# Patient Record
Sex: Male | Born: 1995 | Race: Black or African American | Hispanic: No | Marital: Single | Smoking: Never smoker
Health system: Southern US, Community
[De-identification: ages and names within clinical notes are randomized; demographics above are authoritative.]

---
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
## 2021-02-05 ENCOUNTER — Other Ambulatory Visit: Payer: Self-pay

## 2021-02-05 ENCOUNTER — Encounter: Payer: Self-pay | Admitting: Physical Therapy

## 2021-02-05 ENCOUNTER — Ambulatory Visit: Payer: Medicaid Other | Attending: Orthopaedic Surgery | Admitting: Physical Therapy

## 2021-02-05 DIAGNOSIS — M25512 Pain in left shoulder: Secondary | ICD-10-CM | POA: Insufficient documentation

## 2021-02-05 DIAGNOSIS — M6281 Muscle weakness (generalized): Secondary | ICD-10-CM | POA: Diagnosis present

## 2021-02-05 NOTE — Therapy (Signed)
[facility] [address] [place], [place], [address] Phone: [phone]   Fax:  [phone]  Physical Therapy Evaluation  Patient Details  Name: Lance Hatfield MRN: [id number] Date of Birth: Aug 20, 1995 Referring Provider (PT): Ramond Marrow MD   Encounter Date: 02/05/2021   PT End of Session - 02/05/21 1259     Visit Number 1    Number of Visits 12    Date for PT Re-Evaluation 03/19/21    PT Start Time 0828    PT Stop Time 0855    PT Time Calculation (min) 27 min    Activity Tolerance Patient tolerated treatment well    Behavior During Therapy [facility] for tasks assessed/performed             History reviewed. No pertinent past medical history.  History reviewed. No pertinent surgical history.  There were no vitals filed for this visit.    Subjective Assessment - 02/05/21 0900     Subjective COVID-19 screen performed prior to patient entering clinic.  The patient presents to the clinic today with c/o left shoulder pain due to doing push-ups about a week ago. He states he was in a great deal of pain that evening and had difficulty sleeping due to pain.  His pain is a low 2/10 at rest today.  Certain movements and stretching increase his pain.  "Pressue" makes it better.    Pertinent History Unremarkable.    Patient Stated Goals Workout without pain.    Currently in Pain? Yes    Pain Score 2     Pain Location Shoulder    Pain Orientation Left    Pain Descriptors / Indicators Sore    Pain Type Acute pain    Pain Onset 1 to 4 weeks ago    Pain Frequency Constant    Aggravating Factors  See above.    Pain Relieving Factors See above.                [facility] PT Assessment - 02/05/21 0001       Assessment   Medical Diagnosis Left scapular winging.    Referring Provider (PT) Ramond Marrow MD    Onset Date/Surgical Date --   ~a week ago.     Precautions   Precautions None      Restrictions   Weight Bearing Restrictions No       Balance Screen   Has the patient fallen in the past 6 months No    Has the patient had a decrease in activity level because of a fear of falling?  No    Is the patient reluctant to leave their home because of a fear of falling?  No      Home Environment   Living Environment Private residence      Prior Function   Level of Independence Independent      Posture/Postural Control   Posture Comments Left shoulder depression.      ROM / Strength   AROM / PROM / Strength AROM;Strength      AROM   Overall AROM Comments Full active left shoulder range of motion      Strength   Overall Strength Comments Left shoulder ER is 4-/5.  During a wall push-up he did exhbit some left scapular winging.      Palpation   Palpation comment Tender to palpation over his left Levator scapulae and some discomfort as well in his left posterior cuff region.  Objective measurements completed on examination: See above findings.                    PT Long Term Goals - 02/05/21 1300       PT LONG TERM GOAL #1   Title Independent with a HEP.    Baseline No knowledge of appropriate ther ex.    Time 6    Period Weeks    Status New      PT LONG TERM GOAL #2   Title Left shoulder ER strength to 5/5.    Baseline 4-/5.    Time 6    Period Weeks    Status New      PT LONG TERM GOAL #3   Title Return to working out    Baseline Left shoulder pain prohibits him from working out like he would like (ie:  Push-ups).    Time 6    Period Weeks    Status New                    Plan - 02/05/21 1256     Clinical Impression Statement The patient presents to OPPT with c/o left shoulder pain has the result of doing push-ups recently.  He exhibits full active left shoulder range of motion but he is quite weak into left shoulder ER.  He is tender to palpation over his Levator Scapulae and posterior cuff muscle.  He has a depressed left scapula and  does exhibit some left scapula winging.  Patient will benefit from skilled physical therapy intervention to address pain and deficits.    Personal Factors and Comorbidities Other    Examination-Activity Limitations Other    Examination-Participation Restrictions Other    Stability/Clinical Decision Making Evolving/Moderate complexity    Clinical Decision Making Low    Rehab Potential Excellent    PT Frequency 2x / week    PT Duration 6 weeks    PT Treatment/Interventions ADLs/Self Care Home Management;Cryotherapy;Electrical Stimulation;Ultrasound;Moist Heat;Therapeutic activities;Therapeutic exercise;Neuromuscular re-education;Manual techniques;Patient/family education;Vasopneumatic Device    PT Next Visit Plan Serratus punches, RW4, left scapular exercises, Modalities and STW/M as needed. PRE's.    Consulted and Agree with Plan of Care Patient             Patient will benefit from skilled therapeutic intervention in order to improve the following deficits and impairments:  Pain, Increased muscle spasms, Decreased activity tolerance, Decreased strength  Visit Diagnosis: Acute pain of left shoulder - Plan: PT plan of care cert/re-cert  Muscle weakness (generalized) - Plan: PT plan of care cert/re-cert     Problem List There are no problems to display for this patient.   Gabrielle Mester, [place] MPT 02/05/2021, 1:07 PM  [facility] [address] [place], [place], [address] Phone: [phone]   Fax:  [phone]  Name: Lance Hatfield MRN: [id number] Date of Birth: 03-Aug-1996

## 2021-02-11 ENCOUNTER — Ambulatory Visit: Payer: Medicaid Other | Attending: Orthopaedic Surgery

## 2021-02-11 ENCOUNTER — Other Ambulatory Visit: Payer: Self-pay

## 2021-02-11 DIAGNOSIS — M25512 Pain in left shoulder: Secondary | ICD-10-CM | POA: Insufficient documentation

## 2021-02-11 DIAGNOSIS — M6281 Muscle weakness (generalized): Secondary | ICD-10-CM | POA: Diagnosis present

## 2021-02-11 NOTE — Therapy (Signed)
[facility] [address] [place], [place], [address] Phone: [phone]   Fax:  [phone]  Physical Therapy Treatment  Patient Details  Name: Lance Hatfield MRN: [id number] Date of Birth: [birth year] Referring Provider (PT): Ramond Marrow MD   Encounter Date: 02/11/2021   PT End of Session - 02/11/21 0859     Visit Number 2    Number of Visits 12    Date for PT Re-Evaluation 03/19/21    PT Start Time 0815    PT Stop Time 0856    PT Time Calculation (min) 41 min    Activity Tolerance Patient tolerated treatment well    Behavior During Therapy [facility] for tasks assessed/performed             History reviewed. No pertinent past medical history.  History reviewed. No pertinent surgical history.  There were no vitals filed for this visit.       [facility] Adult PT Treatment/Exercise - 02/11/21 0815       Therapeutic Activites    Therapeutic Activities Other Therapeutic Activities    Other Therapeutic Activities self massage- theracane; education on therapy items to add to home exercise program (recommendations only)      Exercises   Exercises Other Exercises    Other Exercises  UBE x 4 mins fwd/retro; Standing physioball reach overhead x 10 (hold  5 sec), LS stretch; Lat pull down x 15 (with cervical rotation), wall push up/serratus punch; Open book at the wall (withou resistance ) x 10 each side      Manual Therapy   Manual Therapy Scapular mobilization    Manual therapy comments STM to LS and rhomboids    Scapular Mobilization lateral glide of scapula with cervical rot R and SB.                         PT Long Term Goals - 02/05/21 1300       PT LONG TERM GOAL #1   Title Independent with a HEP.    Baseline No knowledge of appropriate ther ex.    Time 6    Period Weeks    Status New      PT LONG TERM GOAL #2   Title Left shoulder ER strength to 5/5.    Baseline 4-/5.    Time 6    Period Weeks    Status  New      PT LONG TERM GOAL #3   Title Return to working out    Baseline Left shoulder pain prohibits him from working out like he would like (ie:  Push-ups).    Time 6    Period Weeks    Status New                   Plan - 02/11/21 0859     Clinical Impression Statement Patient presents to clinic with scapular depression on the L with pain/tenderness at the superior angle. Patient tolerates initial exercise well and reports symptoms subsided at end of session. He compares non-affective side and states that he feels they are about equal. patient was provided HEP and recommendations for usefule tools to promote pain management at home. Skilled PT recommended to emphasize periscapular strengthenign.    Personal Factors and Comorbidities Other    Examination-Activity Limitations Other    Examination-Participation Restrictions Other    Stability/Clinical Decision Making Evolving/Moderate complexity    Rehab Potential Excellent    PT Frequency  2x / week    PT Duration 6 weeks    PT Treatment/Interventions ADLs/Self Care Home Management;Cryotherapy;Electrical Stimulation;Ultrasound;Moist Heat;Therapeutic activities;Therapeutic exercise;Neuromuscular re-education;Manual techniques;Patient/family education;Vasopneumatic Device    PT Next Visit Plan Serratus punches, RW4, left scapular exercises, Modalities and STW/M as needed. PRE's.    Consulted and Agree with Plan of Care Patient             Patient will benefit from skilled therapeutic intervention in order to improve the following deficits and impairments:  Pain, Increased muscle spasms, Decreased activity tolerance, Decreased strength  Visit Diagnosis: Acute pain of left shoulder  Muscle weakness (generalized)     Problem List There are no problems to display for this patient.   Levonne Spiller PT, DPT 02/11/2021, 9:02 AM  [facility] [address] [place], [place],  [address] Phone: [phone]   Fax:  [phone]  Name: PAT SIRES MRN: [id number] Date of Birth: [birth year]

## 2021-02-13 ENCOUNTER — Ambulatory Visit: Payer: Medicaid Other

## 2021-02-13 ENCOUNTER — Other Ambulatory Visit: Payer: Self-pay

## 2021-02-13 DIAGNOSIS — M25512 Pain in left shoulder: Secondary | ICD-10-CM

## 2021-02-13 DIAGNOSIS — M6281 Muscle weakness (generalized): Secondary | ICD-10-CM

## 2021-02-13 NOTE — Therapy (Addendum)
[facility] [place], [place], [address] Phone: [phone]   Fax:  [phone]  Physical Therapy Treatment  Patient Details  Name: Lance Hatfield MRN: [id number] Date of Birth: [birth year] Referring Provider (PT): Ophelia Charter MD   Encounter Date: 02/13/2021   PT End of Session - 02/13/21 1309     Visit Number 3    Number of Visits 12    Date for PT Re-Evaluation 03/19/21    PT Start Time 0815    PT Stop Time 0900    PT Time Calculation (min) 45 min    Activity Tolerance Patient tolerated treatment well    Behavior During Therapy [facility] for tasks assessed/performed             No past medical history on file.  No past surgical history on file.  There were no vitals filed for this visit.   Subjective Assessment - 02/13/21 0840     Subjective COVID-19 screen performed prior to patient entering clinic. He states that he feels prety good today. He has not been having much issue with his shoulder.    Pertinent History Unremarkable.    Patient Stated Goals Workout without pain.    Currently in Pain? Yes    Pain Score 1     Pain Location Shoulder                               [facility] Adult PT Treatment/Exercise - 02/13/21 0815       Therapeutic Activites    Therapeutic Activities Other Therapeutic Activities    Other Therapeutic Activities self massage- theracane; education on therapy items to add to home exercise program (recommendations only)      Exercises   Exercises Other Exercises    Other Exercises  UBE x 8 mins (4x6mins fwd/retro;) Standing physioball roll overhead x 10 (hold  5 sec), standing physioball squeeze);  LS stretch x 20 sec; pec stretch at  wall 3 x 20 sec; standing OH close grip with 10# gel ball) ; Lat pull down blue tband  x 15 (with cervical rotation), Open book at the wall x 10  (with resistance ) x 10 each side      Manual Therapy   Manual Therapy Scapular mobilization     Manual therapy comments STM to LS and rhomboids    Scapular Mobilization lateral glide of scapula with cervical rot R and SB.                         PT Long Term Goals - 02/05/21 1300       PT LONG TERM GOAL #1   Title Independent with a HEP.    Baseline No knowledge of appropriate ther ex.    Time 6    Period Weeks    Status New      PT LONG TERM GOAL #2   Title Left shoulder ER strength to 5/5.    Baseline 4-/5.    Time 6    Period Weeks    Status New      PT LONG TERM GOAL #3   Title Return to working out    Baseline Left shoulder pain prohibits him from working out like he would like (ie:  Push-ups).    Time 6    Period Weeks    Status New  Plan - 02/13/21 1317     Clinical Impression Statement Patient presents to therapay with minimal irritanility of symptoms. He demonstrates carry over of previous HEP provided. He was able to progress to resistance with open book standing at the wall. He also demonstrates excellent progress with managing scapular motor control without compensttion. Skilled PT recommeded to continue per plan of care to encourage UE and periscapular strength.    Personal Factors and Comorbidities Other    Examination-Activity Limitations Other    Examination-Participation Restrictions Other    Stability/Clinical Decision Making Evolving/Moderate complexity    Clinical Decision Making Low    Rehab Potential Excellent    PT Frequency 2x / week    PT Duration 6 weeks    PT Treatment/Interventions ADLs/Self Care Home Management;Cryotherapy;Electrical Stimulation;Ultrasound;Moist Heat;Therapeutic activities;Therapeutic exercise;Neuromuscular re-education;Manual techniques;Patient/family education;Vasopneumatic Device    PT Next Visit Plan Serratus punches, RW4, left scapular exercises, Modalities and STW/M as needed. PRE's.    Consulted and Agree with Plan of Care Patient             Patient will benefit  from skilled therapeutic intervention in order to improve the following deficits and impairments:  Pain, Increased muscle spasms, Decreased activity tolerance, Decreased strength  Visit Diagnosis: Acute pain of left shoulder  Muscle weakness (generalized)     Problem List There are no problems to display for this patient.   Marylou Mccoy PT, DPT 02/13/2021, 1:24 PM  [facility] [place], [place], [address] Phone: [phone]   Fax:  [phone]  Name: Lance Hatfield MRN: [id number] Date of Birth: [birth year]  PHYSICAL THERAPY DISCHARGE SUMMARY  Visits from Start of Care: 3.  Current functional level related to goals / functional outcomes: See above.   Remaining deficits: See below.   Education / Equipment: HEP.   Patient agrees to discharge. Patient goals were not met. Patient is being discharged due to not returning since the last visit.    Mali Applegate MPT

## 2024-02-08 DIAGNOSIS — L2082 Flexural eczema: Secondary | ICD-10-CM | POA: Diagnosis not present

## 2024-02-29 DIAGNOSIS — L209 Atopic dermatitis, unspecified: Secondary | ICD-10-CM | POA: Diagnosis not present

## 2024-02-29 DIAGNOSIS — L299 Pruritus, unspecified: Secondary | ICD-10-CM | POA: Diagnosis not present

## 2024-03-28 DIAGNOSIS — D485 Neoplasm of uncertain behavior of skin: Secondary | ICD-10-CM | POA: Diagnosis not present

## 2024-03-28 DIAGNOSIS — L308 Other specified dermatitis: Secondary | ICD-10-CM | POA: Diagnosis not present

## 2024-03-28 DIAGNOSIS — L209 Atopic dermatitis, unspecified: Secondary | ICD-10-CM | POA: Diagnosis not present

## 2024-03-28 DIAGNOSIS — L299 Pruritus, unspecified: Secondary | ICD-10-CM | POA: Diagnosis not present

## 2024-04-13 DIAGNOSIS — L299 Pruritus, unspecified: Secondary | ICD-10-CM | POA: Diagnosis not present

## 2024-04-13 DIAGNOSIS — L209 Atopic dermatitis, unspecified: Secondary | ICD-10-CM | POA: Diagnosis not present

## 2024-04-13 DIAGNOSIS — L0100 Impetigo, unspecified: Secondary | ICD-10-CM | POA: Diagnosis not present

## 2024-04-27 DIAGNOSIS — L209 Atopic dermatitis, unspecified: Secondary | ICD-10-CM | POA: Diagnosis not present

## 2024-05-10 DIAGNOSIS — L209 Atopic dermatitis, unspecified: Secondary | ICD-10-CM | POA: Diagnosis not present
